# Patient Record
Sex: Male | Born: 1980 | Race: White | Hispanic: Yes | Marital: Married | Smoking: Former smoker
Health system: Southern US, Community
[De-identification: ages and names within clinical notes are randomized; demographics above are authoritative.]

## PROBLEM LIST (undated history)

## (undated) DIAGNOSIS — K529 Noninfective gastroenteritis and colitis, unspecified: Secondary | ICD-10-CM

---

## 2016-08-25 ENCOUNTER — Emergency Department (HOSPITAL_COMMUNITY)
Admission: EM | Admit: 2016-08-25 | Discharge: 2016-08-25 | Disposition: A | Discharge status: Home / Self Care | Payer: Self-pay | Attending: Emergency Medicine | Admitting: Emergency Medicine

## 2016-08-25 ENCOUNTER — Encounter (HOSPITAL_COMMUNITY): Payer: Self-pay | Admitting: Emergency Medicine

## 2016-08-25 ENCOUNTER — Emergency Department (HOSPITAL_COMMUNITY): Payer: Self-pay

## 2016-08-25 DIAGNOSIS — Z87891 Personal history of nicotine dependence: Secondary | ICD-10-CM | POA: Insufficient documentation

## 2016-08-25 DIAGNOSIS — J111 Influenza due to unidentified influenza virus with other respiratory manifestations: Secondary | ICD-10-CM | POA: Insufficient documentation

## 2016-08-25 HISTORY — DX: Noninfective gastroenteritis and colitis, unspecified: K52.9

## 2016-08-25 LAB — COMPREHENSIVE METABOLIC PANEL
ALT: 61 U/L (ref 17–63)
AST: 61 U/L — ABNORMAL HIGH (ref 15–41)
Albumin: 4.4 g/dL (ref 3.5–5.0)
Alkaline Phosphatase: 102 U/L (ref 38–126)
Anion gap: 12 (ref 5–15)
BUN: 15 mg/dL (ref 6–20)
CO2: 23 mmol/L (ref 22–32)
Calcium: 9.7 mg/dL (ref 8.9–10.3)
Chloride: 102 mmol/L (ref 101–111)
Creatinine, Ser: 1.5 mg/dL — ABNORMAL HIGH (ref 0.61–1.24)
GFR calc Af Amer: >60 mL/min (ref >60)
GFR calc non Af Amer: 60 mL/min — ABNORMAL LOW (ref >60)
Glucose, Bld: 104 mg/dL — ABNORMAL HIGH (ref 65–99)
Potassium: 3.6 mmol/L (ref 3.5–5.1)
Sodium: 137 mmol/L (ref 135–145)
Total Bilirubin: 0.5 mg/dL (ref 0.3–1.2)
Total Protein: 7.9 g/dL (ref 6.5–8.1)

## 2016-08-25 LAB — CBC WITH DIFFERENTIAL/PLATELET
Basophils Absolute: 0 10*3/uL (ref 0.0–0.1)
Basophils Relative: 0 %
Eosinophils Absolute: 0.1 10*3/uL (ref 0.0–0.7)
Eosinophils Relative: 0 %
HCT: 44.3 % (ref 39.0–52.0)
Hemoglobin: 15.3 g/dL (ref 13.0–17.0)
Lymphocytes Relative: 7 %
Lymphs Abs: 1.1 10*3/uL (ref 0.7–4.0)
MCH: 28.5 pg (ref 26.0–34.0)
MCHC: 34.5 g/dL (ref 30.0–36.0)
MCV: 82.6 fL (ref 78.0–100.0)
Monocytes Absolute: 0.7 10*3/uL (ref 0.1–1.0)
Monocytes Relative: 5 %
Neutro Abs: 12.9 10*3/uL — ABNORMAL HIGH (ref 1.7–7.7)
Neutrophils Relative %: 88 %
Platelets: 204 10*3/uL (ref 150–400)
RBC: 5.36 MIL/uL (ref 4.22–5.81)
RDW: 13 % (ref 11.5–15.5)
WBC: 14.8 10*3/uL — ABNORMAL HIGH (ref 4.0–10.5)

## 2016-08-25 LAB — URINALYSIS, ROUTINE W REFLEX MICROSCOPIC
Bilirubin Urine: NEGATIVE
Glucose, UA: NEGATIVE mg/dL
Hgb urine dipstick: NEGATIVE
Ketones, ur: NEGATIVE mg/dL
Leukocytes, UA: NEGATIVE
Nitrite: NEGATIVE
Protein, ur: NEGATIVE mg/dL
Specific Gravity, Urine: 1.023 (ref 1.005–1.030)
pH: 5 (ref 5.0–8.0)

## 2016-08-25 LAB — RAPID STREP SCREEN (MED CTR MEBANE ONLY): Streptococcus, Group A Screen (Direct): NEGATIVE

## 2016-08-25 NOTE — ED Provider Notes (Signed)
[facility]-EMERGENCY DEPT Provider Note   CSN: [id number] Arrival date & time: 08/25/16  1847     History   Chief Complaint Chief Complaint  Patient presents with  . Fever  . Influenza  . Back Pain    HPI Derek Hood is a 36 y.o. male.She complains bodyaches and fever.  HPI presents with a three-day illness. Fevers chills body aches and pain and headache. Also sore throat. He did receive a flu vaccine.  Does not feel short of breath. Mild nausea. No diarrhea.  Past Medical History:  Diagnosis Date  . Gastroenteritis     There are no active problems to display for this patient.   No past surgical history on file.     Home Medications    Prior to Admission medications   Not on File    Family History No family history on file.  Social History Social History  Substance Use Topics  . Smoking status: Former Games developermoker  . Smokeless tobacco: Never Used  . Alcohol use Yes     Comment: 2X a year     Allergies   Patient has no allergy information on record.   Review of Systems Review of Systems  Constitutional: Positive for fatigue and fever. Negative for appetite change, chills and diaphoresis.  HENT: Positive for sore throat. Negative for mouth sores and trouble swallowing.   Eyes: Negative for visual disturbance.  Respiratory: Negative for cough, chest tightness, shortness of breath and wheezing.   Cardiovascular: Negative for chest pain.  Gastrointestinal: Negative for abdominal distention, abdominal pain, diarrhea, nausea and vomiting.  Endocrine: Negative for polydipsia, polyphagia and polyuria.  Genitourinary: Negative for dysuria, frequency and hematuria.  Musculoskeletal: Positive for myalgias. Negative for gait problem.  Skin: Negative for color change, pallor and rash.  Neurological: Positive for headaches. Negative for dizziness, syncope and light-headedness.  Hematological: Does not bruise/bleed easily.  Psychiatric/Behavioral: Negative for  behavioral problems and confusion.     Physical Exam Updated Vital Signs BP 114/68   Pulse 107   Temp 99.8 F (37.7 C) (Oral)   Resp 16   Ht 5\' 8"  (1.727 m)   Wt 203 lb 2 oz (92.1 kg)   SpO2 97%   BMI 30.89 kg/m   Physical Exam  Constitutional: He is oriented to person, place, and time. He appears well-developed and well-nourished. No distress.  HENT:  Head: Normocephalic.  Exudate on the tonsils bilaterally. No anterior cervical lymphadenopathy. Tongue does not have a strawberry appearance.  Eyes: Conjunctivae are normal. Pupils are equal, round, and reactive to light. No scleral icterus.  Neck: Normal range of motion. Neck supple. No thyromegaly present.  Cardiovascular: Normal rate and regular rhythm.  Exam reveals no gallop and no friction rub.   No murmur heard. Pulmonary/Chest: Effort normal and breath sounds normal. No respiratory distress. He has no wheezes. He has no rales.  Breath sounds. Diffuse tenderness in the musculature of the back  Abdominal: Soft. Bowel sounds are normal. He exhibits no distension. There is no tenderness. There is no rebound.  Musculoskeletal: Normal range of motion.  Neurological: He is alert and oriented to person, place, and time.  Skin: Skin is warm and dry. No rash noted.  Psychiatric: He has a normal mood and affect. His behavior is normal.     ED Treatments / Results  Labs (all labs ordered are listed, but only abnormal results are displayed) Labs Reviewed  COMPREHENSIVE METABOLIC PANEL - Abnormal; Notable for the following:  Result Value   Glucose, Bld 104 (*)    Creatinine, Ser 1.50 (*)    AST 61 (*)    GFR calc non Af Amer 60 (*)    All other components within normal limits  CBC WITH DIFFERENTIAL/PLATELET - Abnormal; Notable for the following:    WBC 14.8 (*)    Neutro Abs 12.9 (*)    All other components within normal limits  RAPID STREP SCREEN (NOT AT [facility])  CULTURE, GROUP A STREP ([facility])  URINALYSIS, ROUTINE W  REFLEX MICROSCOPIC    EKG  EKG Interpretation None       Radiology Dg Chest 2 View  Result Date: 08/25/2016 CLINICAL DATA:  Cough and congestion. EXAM: CHEST  2 VIEW COMPARISON:  None. FINDINGS: The cardiomediastinal silhouette is unremarkable. There is no evidence of focal airspace disease, pulmonary edema, suspicious pulmonary nodule/mass, pleural effusion, or pneumothorax. No acute bony abnormalities are identified. IMPRESSION: No active cardiopulmonary disease. Electronically Signed   By: Harmon Pier M.D.   On: 08/25/2016 20:13    Procedures Procedures (including critical care time)  Medications Ordered in ED Medications - No data to display   Initial Impression / Assessment and Plan / ED Course  I have reviewed the triage vital signs and the nursing notes.  Pertinent labs & imaging results that were available during my care of the patient were reviewed by me and considered in my medical decision making (see chart for details).     Problem or influenza. Out of the window for treatment. Negative swab for flu. Normal pulmonary exam and chest x-ray. Plan expectant management. Rest fluids Motrin.  Final Clinical Impressions(s) / ED Diagnoses   Final diagnoses:  Influenza    New Prescriptions New Prescriptions   No medications on file     Rolland Porter, MD 08/25/16 2303

## 2016-08-25 NOTE — ED Triage Notes (Signed)
Pt reports that he began to feel poorly, including N/V, headache, dizzy, no appetite, generalized body ache, chills since Thrusday. Pt reports that his back began to experience new onset lower back pain at that time as well.  Pt was given ibuprofen before coming to the ED for his fever.

## 2016-08-25 NOTE — Discharge Instructions (Signed)
Rest, fluids, motrin.

## 2016-08-28 LAB — CULTURE, GROUP A STREP (THRC)

## 2018-05-16 ENCOUNTER — Other Ambulatory Visit: Payer: Self-pay

## 2018-05-16 ENCOUNTER — Emergency Department (HOSPITAL_COMMUNITY): Payer: Self-pay

## 2018-05-16 ENCOUNTER — Encounter (HOSPITAL_COMMUNITY): Payer: Self-pay

## 2018-05-16 ENCOUNTER — Emergency Department (HOSPITAL_COMMUNITY)
Admission: EM | Admit: 2018-05-16 | Discharge: 2018-05-16 | Disposition: A | Discharge status: Home / Self Care | Payer: Self-pay | Attending: Emergency Medicine | Admitting: Emergency Medicine

## 2018-05-16 DIAGNOSIS — B9789 Other viral agents as the cause of diseases classified elsewhere: Secondary | ICD-10-CM

## 2018-05-16 DIAGNOSIS — Z87891 Personal history of nicotine dependence: Secondary | ICD-10-CM | POA: Insufficient documentation

## 2018-05-16 DIAGNOSIS — J020 Streptococcal pharyngitis: Secondary | ICD-10-CM | POA: Insufficient documentation

## 2018-05-16 DIAGNOSIS — J069 Acute upper respiratory infection, unspecified: Secondary | ICD-10-CM | POA: Insufficient documentation

## 2018-05-16 LAB — GROUP A STREP BY PCR: Group A Strep by PCR: DETECTED — AB

## 2018-05-16 MED ORDER — AZITHROMYCIN 250 MG PO TABS: 500.0000 mg | ORAL_TABLET | Freq: Every day | ORAL | 0 refills | Status: AC

## 2018-05-16 NOTE — ED Notes (Signed)
Pt ambulated self efficiently with no difficulty. Pt O2 @ 100% RA before ambulation. During ambulation O2 97% RA. Pt returned safely back to bedside. O2 increased to 100% RA.

## 2018-05-16 NOTE — ED Provider Notes (Signed)
[facility] EMERGENCY DEPARTMENT Provider Note   CSN: [id number] Arrival date & time: 05/16/18  4098     History   Chief Complaint Chief Complaint  Patient presents with  . URI    HPI Derek Hood is a 37 y.o. male with no significant PMH is here for evaluation of cold symptoms.  Onset 1 week ago, gradually worsening.  He reports sore throat, nasal congestion, runny nose, dry cough, intermittent headaches, right-sided facial and neck pain, lightheadedness, nausea.  Wife has noticed that he will get winded and starts to breathe harder with any activity, patient states that his breathing gets hard when he is exerting himself.  He try to go to work couple of days ago but he was very exhausted and short of breath.  Has been taking Sudafed and Mucinex with no relief.  He feels hot inside but wife has been checking for fevers and he has not had any.  His younger daughter tested positive for strep 1 week ago, pediatrician told mother that she is probably a carrier but did not have an acute infection at that time.  No fevers, chest pain, abdominal pain, changes in bowel movements, dysuria, hematuria.  HPI  Past Medical History:  Diagnosis Date  . Gastroenteritis     There are no active problems to display for this patient.   History reviewed. No pertinent surgical history.      Home Medications    Prior to Admission medications   Medication Sig Start Date End Date Taking? Authorizing Provider  azithromycin (ZITHROMAX) 250 MG tablet Take 2 tablets (500 mg total) by mouth daily for 10 days. Take 2 tablets daily for 10 days 05/16/18 05/26/18  Liberty Handy, PA-C    Family History History reviewed. No pertinent family history.  Social History Social History   Tobacco Use  . Smoking status: Former Games developer  . Smokeless tobacco: Never Used  Substance Use Topics  . Alcohol use: Yes    Comment: 2X a year  . Drug use: No     Allergies     Shellfish allergy   Review of Systems Review of Systems  Constitutional: Positive for chills, fatigue and fever.  HENT: Positive for congestion, rhinorrhea and sore throat.   Respiratory: Positive for cough and shortness of breath.   Gastrointestinal: Positive for nausea.  Allergic/Immunologic: Negative for immunocompromised state.  Neurological: Positive for light-headedness and headaches.  All other systems reviewed and are negative.    Physical Exam Updated Vital Signs BP 124/83 (BP Location: Right Arm)   Pulse 76   Temp 99.3 F (37.4 C) (Oral)   Resp 18   SpO2 100%   Physical Exam  Constitutional: He is oriented to person, place, and time. He appears well-developed and well-nourished.  Sounds congested but is nontoxic-appearing  HENT:  Head: Normocephalic and atraumatic.  Nose: Nose normal.  Moderate mucosal edema with erythema and clear rhinorrhea.  Septum midline. Soft palate and oropharynx are erythematous.  Did not visualize tonsils. Uvula is midline without deviation Moist mucous membranes.  No sublingual fullness, tenderness.  Normal protrusion of the tongue.  No muffled voice.  Tolerating secretions. TMs normal.  No mastoid tenderness.  External ears normal.  Eyes: Pupils are equal, round, and reactive to light. Conjunctivae and EOM are normal.  Neck: Normal range of motion.  Diffuse tenderness below the angle of the right mandible, submandibular space without obvious edema, erythema, warmth, lymphadenopathy, asymmetry.  Nonpalpable thyroid.  Full range of  motion of the neck without any pain.  Cardiovascular: Normal rate, regular rhythm and normal heart sounds.  Pulmonary/Chest: Effort normal and breath sounds normal.  Abdominal: Soft. Bowel sounds are normal.  No G/R/R. No suprapubic or CVA tenderness. Negative Murphy's and McBurney's  Musculoskeletal: Normal range of motion.  Neurological: He is alert and oriented to person, place, and time.  Skin: Skin is  warm and dry. Capillary refill takes less than 2 seconds.  Psychiatric: He has a normal mood and affect. His behavior is normal. Judgment and thought content normal.  Nursing note and vitals reviewed.    ED Treatments / Results  Labs (all labs ordered are listed, but only abnormal results are displayed) Labs Reviewed  GROUP A STREP BY PCR - Abnormal; Notable for the following components:      Result Value   Group A Strep by PCR DETECTED (*)    All other components within normal limits  GROUP A STREP BY PCR    EKG None  Radiology Dg Chest 2 View  Result Date: 05/16/2018 CLINICAL DATA:  Cough, fever, and chills. EXAM: CHEST - 2 VIEW COMPARISON:  Chest x-ray dated August 25, 2016. FINDINGS: The heart size and mediastinal contours are within normal limits. Both lungs are clear. The visualized skeletal structures are unremarkable. IMPRESSION: No active cardiopulmonary disease. Electronically Signed   By: Obie Dredge M.D.   On: 05/16/2018 10:36    Procedures Procedures (including critical care time)  Medications Ordered in ED Medications - No data to display   Initial Impression / Assessment and Plan / ED Course  I have reviewed the triage vital signs and the nursing notes.  Pertinent labs & imaging results that were available during my care of the patient were reviewed by me and considered in my medical decision making (see chart for details).  Clinical Course as of May 16 1341  Fri May 16, 2018  1233 Apologized about delay regarding CXR and strep. Will contact radiology regarding results.    [CG]  1235 Called radiology result at 1036 "no active disease", issue with results transfering into epic.    [CG]  1324 Group A Strep by PCR(!): DETECTED [CG]    Clinical Course User Index [CG] Liberty Handy, PA-C    Likely viral syndrome.  Will get rapid strep and CXR.  Reportedly, daughter is strep carrier.  She tested positive for strep last week.  He has right sided  neck and ear pain however to signs of swelling, lymphadenopathy. TMs normal. No mastoid tenderness. No asymmetry to neck or rigidity.  Throat exam mostly reassuring w/o obvious tonsillar hypertrophy or exudates.  Doubt deep neck infection or Ludwig's.  He reports Sob with exertion but no CP. He is young and I doubt cardiac etiology.  He has no cardiac risk factors. Will check pulse ox with ambulation.   1340: CXR negative. Rapid strep positive.  He ambulated without hypoxia.  He is tolerating PO. Will discharge with azithro to cover strep and possible early PNA. Symptomatic management encouraged. Discussed results and plan with pt and wife who verbalized understanding and in agreement. Return precautions given.   Final Clinical Impressions(s) / ED Diagnoses   Final diagnoses:  Viral URI with cough  Strep pharyngitis    ED Discharge Orders         Ordered    azithromycin (ZITHROMAX) 250 MG tablet  Daily     05/16/18 1328           Rosemarie Galvis,  Delia Heady, PA-C 05/16/18 1342    Mesner, Barbara Cower, MD 05/16/18 1705

## 2018-05-16 NOTE — Discharge Instructions (Addendum)
Your symptoms are most likely from a virus that has caused an upper respiratory infection. Your chest x-ray is negative. Your rapid strep test was POSITIVE. We will treat you with antibiotics.   The main treatment approach for a viral upper respiratory infection is to treat the symptoms, support your immune system and prevent spread of illness.   Stay well-hydrated. Rest. You can use over the counter medications to help with symptoms: 600 mg ibuprofen (motrin, aleve, advil) or acetaminophen (tylenol) every 6 hours, around the clock to help with associated fevers, sore throat, headaches, generalized body aches and malaise.  Oxymetazoline (afrin) intranasal spray once daily for no more than 3 days to help with congestion, after 3 days you can switch to another over-the-counter nasal steroid spray such as fluticasone (flonase) Allergy medication (loratadine, cetirizine, etc) and phenylephrine (sudafed) help with nasal congestion, runny nose and postnasal drip.   Dextromethorphan (Delsym) to suppress cough without mucus Guaifenesin (mucinex) to help with built up mucus in chest and productive cough Wash your hands often to prevent spread.   Return for fever 100.11F, chest pain, shortness of breath, worsening symptoms, worsening sore throat, drooling, difficulty opening jaw or swallowing

## 2018-05-16 NOTE — ED Triage Notes (Signed)
Pt states he was here with his daughter last week and is afraid he got sick. He states sore throat, cough, facial pain. Pt alert and oriented, afebrile.

## 2018-05-16 NOTE — ED Notes (Signed)
Family states  35 yo child was seen for same last week and realized that she was allergic to pcn, was told that that 37 yo was ? Carrier for strep

## 2020-02-16 IMAGING — DX DG CHEST 2V
2 series · 2 of 2 positions shown · non-contrast
Comparison: Chest x-ray dated August 25, 2016.

CLINICAL DATA: Cough, fever, and chills.

EXAM:
CHEST - 2 VIEW

[chest pa]
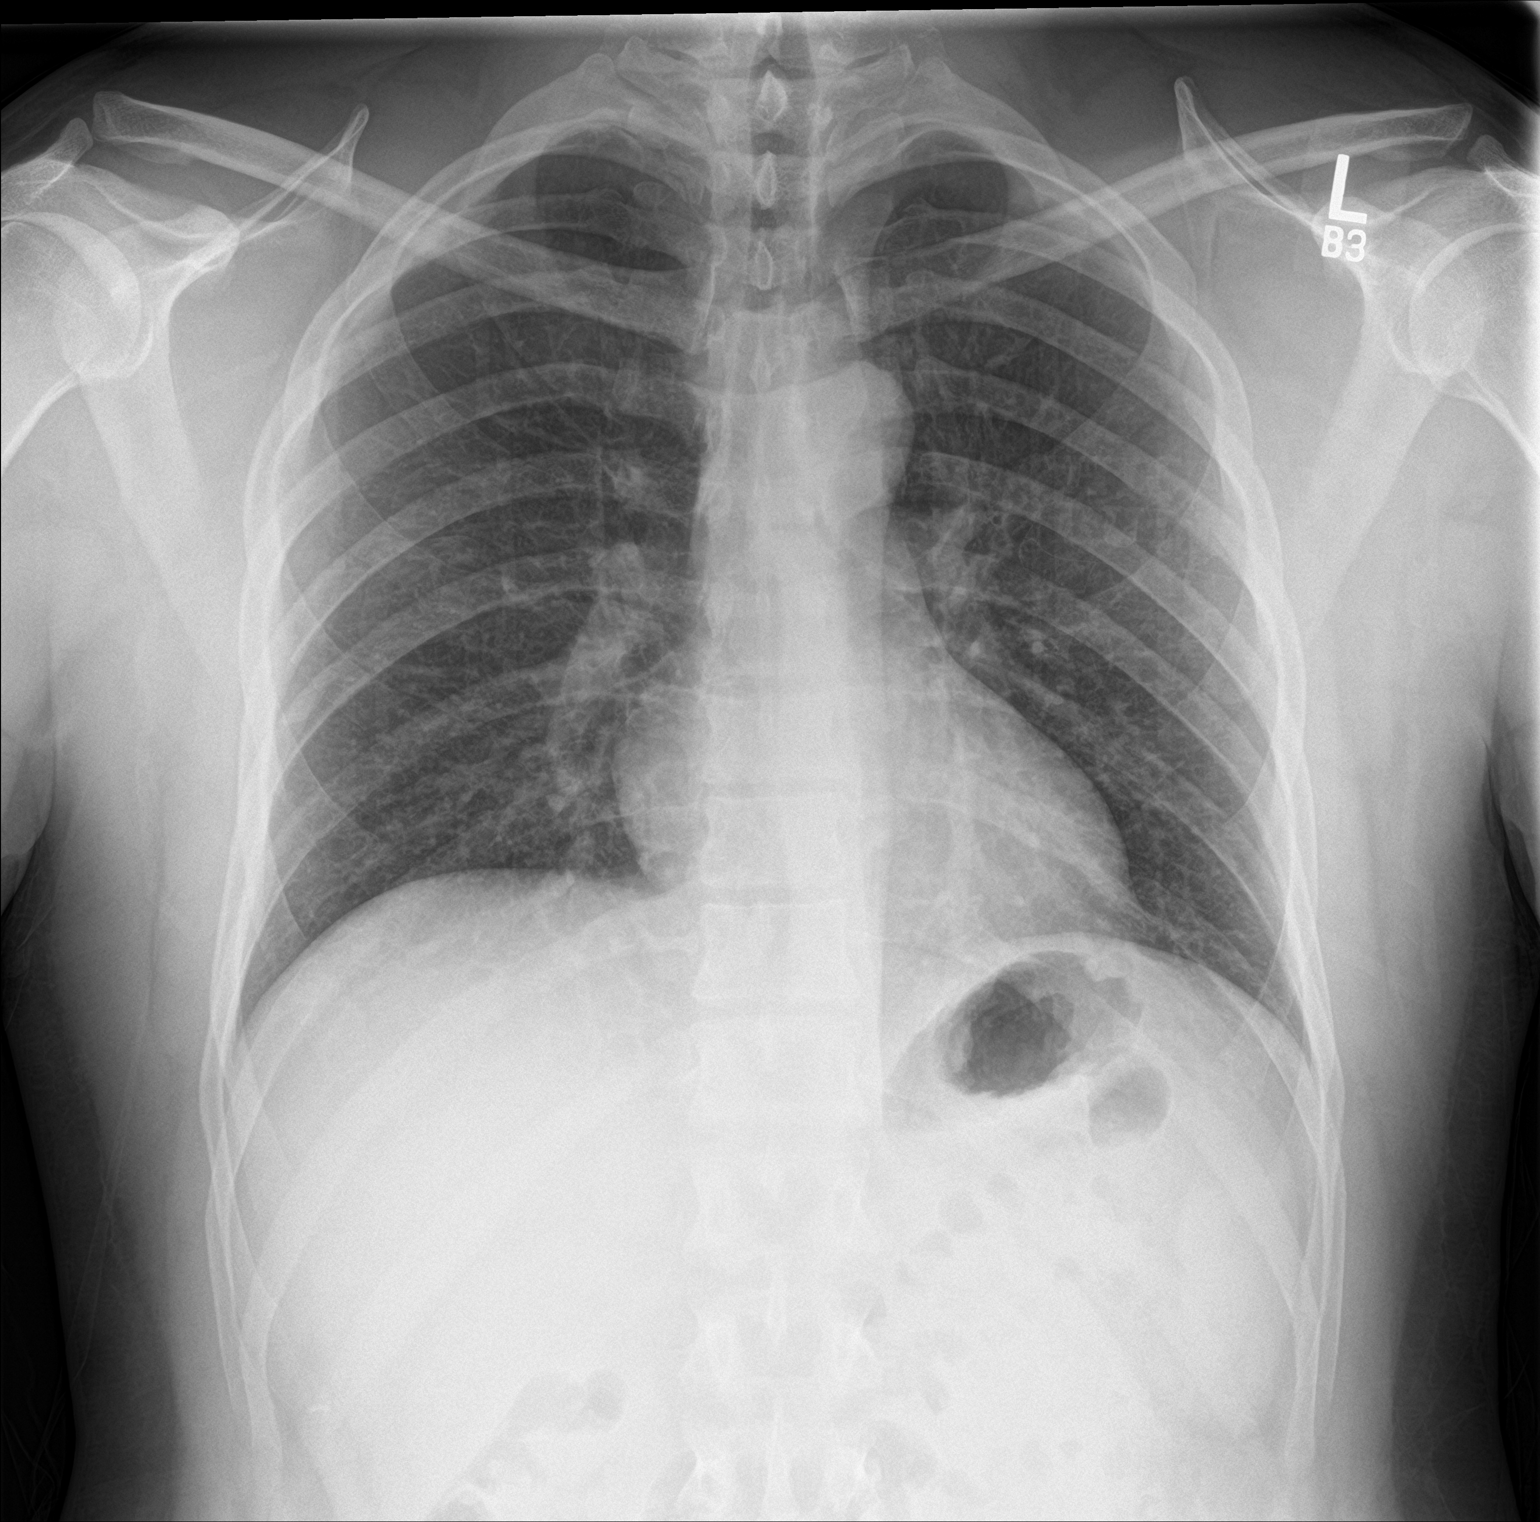

[chest lat]
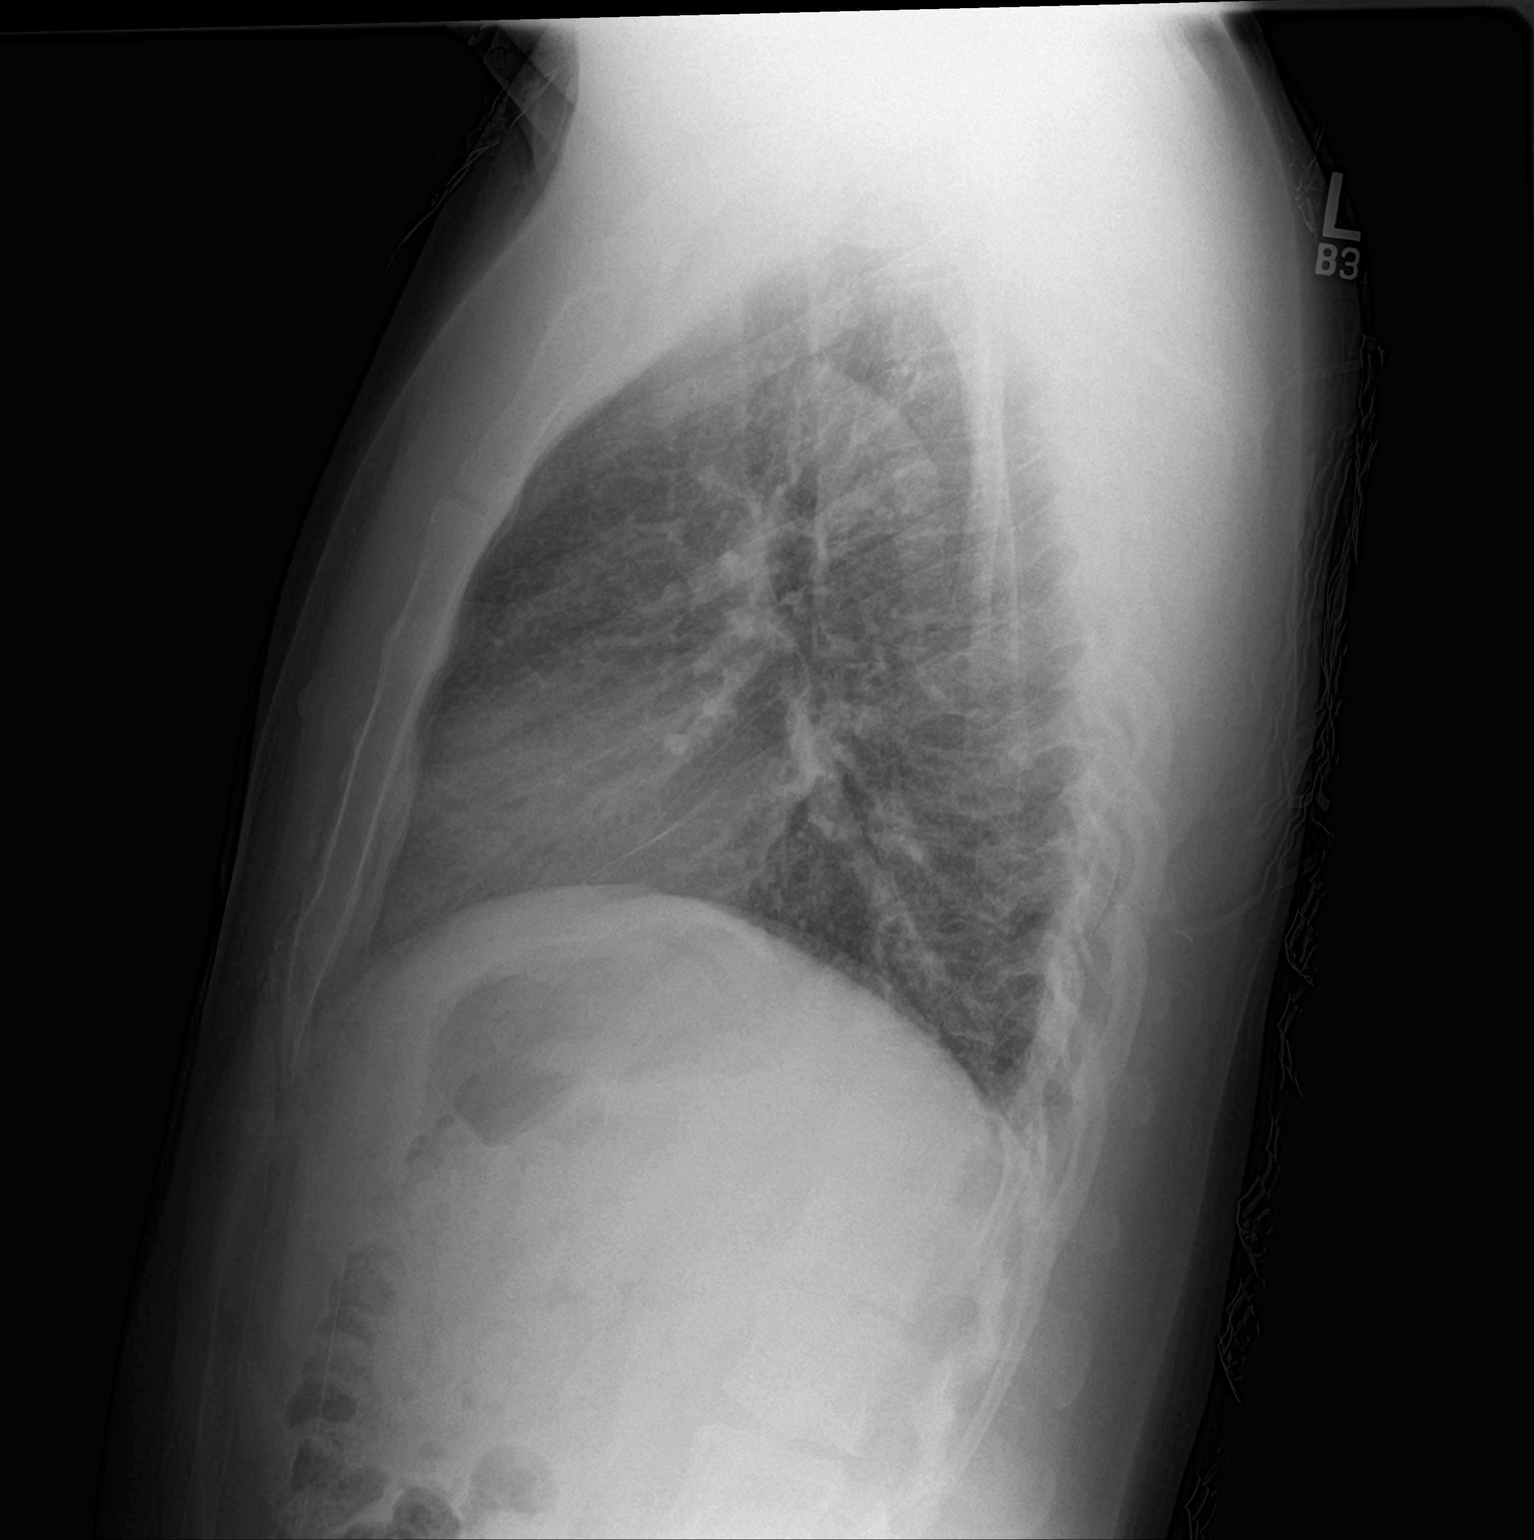

[2 of 2 positions shown; findings below may reference images not displayed]

FINDINGS: The heart size and mediastinal contours are within normal limits.
Both lungs are clear. The visualized skeletal structures are
unremarkable.
IMPRESSION: No active cardiopulmonary disease.
